# Patient Record
Sex: Female | Born: 1985 | Race: White | Hispanic: No | Marital: Married | Smoking: Never smoker
Health system: Southern US, Community
[De-identification: ages and names within clinical notes are randomized; demographics above are authoritative.]

## PROBLEM LIST (undated history)

## (undated) DIAGNOSIS — N83209 Unspecified ovarian cyst, unspecified side: Secondary | ICD-10-CM

## (undated) DIAGNOSIS — Z789 Other specified health status: Secondary | ICD-10-CM

## (undated) DIAGNOSIS — N943 Premenstrual tension syndrome: Secondary | ICD-10-CM

## (undated) HISTORY — DX: Premenstrual tension syndrome: N94.3

## (undated) HISTORY — PX: WISDOM TOOTH EXTRACTION: SHX21

## (undated) HISTORY — PX: NO PAST SURGERIES: SHX2092

---
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
## 2008-01-14 ENCOUNTER — Ambulatory Visit: Payer: Self-pay | Admitting: Internal Medicine

## 2008-08-22 ENCOUNTER — Ambulatory Visit: Payer: Self-pay | Admitting: Internal Medicine

## 2009-02-26 ENCOUNTER — Ambulatory Visit: Payer: Self-pay | Admitting: Internal Medicine

## 2009-09-24 ENCOUNTER — Ambulatory Visit: Payer: Self-pay | Admitting: Internal Medicine

## 2009-10-19 ENCOUNTER — Ambulatory Visit: Payer: Self-pay | Admitting: Internal Medicine

## 2010-02-09 ENCOUNTER — Ambulatory Visit: Payer: Self-pay | Admitting: Internal Medicine

## 2010-02-11 ENCOUNTER — Ambulatory Visit: Payer: Self-pay | Admitting: Internal Medicine

## 2010-04-19 ENCOUNTER — Ambulatory Visit (INDEPENDENT_AMBULATORY_CARE_PROVIDER_SITE_OTHER): Payer: BC Managed Care – PPO | Admitting: Internal Medicine

## 2010-04-19 DIAGNOSIS — J069 Acute upper respiratory infection, unspecified: Secondary | ICD-10-CM

## 2010-07-09 NOTE — Discharge Summary (Signed)
NAMEMarland Kitchen  Belinda Keith, Belinda Keith NO.:  [id number]   MEDICAL RECORD NO.:  [id number]          PATIENT TYPE:  INP   LOCATION:  6120                         FACILITY:  [facility]   PHYSICIAN:  Caryn Bee, M.D.   DATE OF BIRTH:  10/29/1985   DATE OF ADMISSION:  04/04/2004  DATE OF DISCHARGE:  04/05/2004                                 DISCHARGE SUMMARY   Belinda Keith is a 25 year old Caucasian female who was brought to the emergency  room on the evening of April 04, 2004, secondary to acute alcohol  ingestion at a school party.  The specific time line is that she attended a  pre-party where they had dinner with several friends and en route in the  car, apparently she drank 10-15 ounces of a clear liquid reportedly to be  vodka or a combination of vodka and water.  Arriving at the school party at  approximately 10:30, she was noted apparently by a school official to be  walking somewhat erratically but it was conveyed to the school official that  she was fine.  Unfortunately, she was then found in the bathroom around  11:30 vomiting profusely, she was taken to the administrators office where  she continued to vomit on or about 11:45.  The mother had been called and  arrived in about 5-10 minutes.  Secondary to continued vomiting and her  becoming nonresponsive, EMS was called.  The child was brought by EMS to the  [facility] Emergency Room.  En route, they had to bag mask the child  inbetween episodes of vomiting.  While in the ER, the ER attending assessed  her and made the determination she was unable to protect her airway and,  therefore, intubated her using Etomidate, Ativan, Versed, succinylcholine,  and Pancuronium.  She was placed on maintenance IV fluids.  Her labs at that  time indicated an alcohol level of 252.  Her H&H was 11.9 and 33.5 with a  white count 7.9 and platelets 202.  Her electrolytes were normal, as well,  with a glucose of 103.  Her liver enzymes were  normal.  Her urine tox screen  was normal.  Her chest x-ray indicated no acute infiltrate.  Her EKG was  within normal limits.  There was a concern for possible additional  ingestions, conversations with the mother and school friends who attended  the party, stated there was no indications that she ingested anything else.  Once stabilized, she was transferred to the PICU and maintained on a Versed  drip with Versed boluses for agitation.  She required soft restraints.  By  approximately 8:30 to 9 a.m. on February 12, she was extubated.  She was  noted to be in stable condition.  She was subsequently transferred to the  floor that afternoon and observed overnight.  The PICU attendings worked  with the family answering specific questions.  There were several social  issues that needed to be resolved with the mother and the child which will  be handled on an outpatient basis.   PROCEDURE:  1.  Intubated in the emergency room  secondary to inability to  protect her      airway.  2.  Extubated in the PICU the morning of April 04, 2004, without      complications.   DISCHARGE DIAGNOSIS:  Acute alcohol intoxication.   MEDICATIONS:  In the emergency room, she received Versed, Ativan, Etomidate,  succinylcholine pancuronium, placed on maintenance IV fluids.  In the PICU,  she was maintained with Versed infusion and boluses as needed, and  maintenance IV fluids.  On the floor, she received Tylenol for pain and was  discharged on Tylenol for pain and discomfort.   Her discharge weight was 50 kilograms.  Discharge condition stable.   DISCHARGE INSTRUCTIONS:  1.  Conversations with Dr. Donnie Coffin, the patient will see Dr. Donnie Coffin on April 06, 2004, after hours.  2.  Counseling to be arranged via Dr. Donnie Coffin, will attempt with Dr. Sharol Harness      and Dr. Lindie Spruce, who forwarded Dr. Donnie Coffin and the mother possible      recommendations for counselors in the area.      PR/MEDQ  D:  04/05/2004  T:   04/05/2004  Job:  [id number]

## 2010-07-09 NOTE — Discharge Summary (Signed)
Belinda Keith NO.:  [id number]   MEDICAL RECORD NO.:  [id number]          PATIENT TYPE:  INP   LOCATION:  6121                         FACILITY:  [facility]   PHYSICIAN:  Henrietta Hoover, MD    DATE OF BIRTH:  [birth year]   DATE OF ADMISSION:  04/07/2004  DATE OF DISCHARGE:  04/08/2004                                 DISCHARGE SUMMARY   SIGNIFICANT FINDINGS:  Belinda Keith is a 25 year old female with recent  admission for acute alcohol intoxication who was discharged two days prior  to admission on April 05, 2004.  She had a one day history of abdominal  pain, nausea, some diarrhea, dizziness, and poor p.o. intake.  Initial labs  included a WBC 7.6, hemoglobin 15.3, hematocrit 43, platelets 247, ANC 5.2.  Sodium 139, potassium 4.8, chloride 107, CO2 25, BUN 12, creatinine 0.7,  platelets 74.  AST 40, ALT 15, albumin 4, alkaline phos 74, total bilirubin  1.4, total protein 7.2.  Amylase 91, lipase 20.  Urinalysis negative.  Alcohol less than 5.   HOSPITAL COURSE:  Belinda Keith was treated with an LR bolus x 2, half  maintenance IV fluids, clear diet that was advanced to a regular diet, GI  cocktail, and Tylenol p.r.n.  Prior to discharge, Belinda Keith was tolerating a  regular diet without any complications.  She does have a small amount of  diarrhea that remains prior to discharge.   OPERATIONS AND PROCEDURES:  None.   DIAGNOSIS:  Dehydration, viral gastroenteritis.   DISCHARGE MEDICATIONS:  1.  GI cocktail (180 mL regular Maalox, 180 mL 2% viscous lidocaine, 90 mL      Donnatal elixir) 5 mL p.o. q.6h. p.r.n.  2.  Zantac 75 mg 1 tablet p.o. daily to b.i.d. p.r.n.   DISCHARGE INSTRUCTIONS:  Drink plenty of fluids, at least 8 ounces every 3  hours, rest, eat BRAT diet when have diarrhea.   PENDING ISSUES:  None.   FOLLOW UP:  Dr. Donnie Coffin as needed.  Discharge weight 50.8 kilograms.   CONDITION ON DISCHARGE:  Improved.      PR/MEDQ  D:  04/08/2004  T:   04/08/2004  Job:  [id number]

## 2010-09-20 ENCOUNTER — Encounter: Payer: Self-pay | Admitting: Internal Medicine

## 2010-09-20 ENCOUNTER — Ambulatory Visit (INDEPENDENT_AMBULATORY_CARE_PROVIDER_SITE_OTHER): Payer: BC Managed Care – PPO | Admitting: Internal Medicine

## 2010-09-20 VITALS — BP 118/68 | HR 68 | Temp 99.0°F | Ht 63.0 in | Wt 138.0 lb

## 2010-09-20 DIAGNOSIS — J069 Acute upper respiratory infection, unspecified: Secondary | ICD-10-CM

## 2010-09-20 NOTE — Progress Notes (Signed)
  Subjective:    Patient ID: Belinda Keith, female    DOB: [birth year], 25 y.o.   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 Has had cough and congestion. Bringing up discolored sputum. Denies fever or shaking chills. Cough is worse in the morning. Some cough at night.    Review of Systems     Objective:   Physical Exam  right TM slightly retracted but not red,  left TM dull; pharynx clear without exudate; neck supple without adenopathy; chest clear to auscultation        Assessment & Plan:   URI  Zithromax Z-PAK (6 tablets ) 2 by mouth day one followed by 1 by mouth days 2 through 5 with no refill

## 2010-09-27 ENCOUNTER — Encounter: Payer: Self-pay | Admitting: Internal Medicine

## 2010-09-27 ENCOUNTER — Ambulatory Visit (INDEPENDENT_AMBULATORY_CARE_PROVIDER_SITE_OTHER): Payer: BC Managed Care – PPO | Admitting: Internal Medicine

## 2010-09-27 VITALS — Temp 98.1°F

## 2010-09-27 DIAGNOSIS — Z8659 Personal history of other mental and behavioral disorders: Secondary | ICD-10-CM

## 2010-09-27 DIAGNOSIS — J40 Bronchitis, not specified as acute or chronic: Secondary | ICD-10-CM

## 2010-09-27 NOTE — Progress Notes (Signed)
  Subjective:    Patient ID: Belinda Keith, female    DOB: 25-Jun-1985, 25 y.o.   MRN: [id number]  HPI patient here recently for protracted cough of 3 weeks duration. Has been treated with a Zithromax Z-Pak and is still coughing. Now says cough is less dry and more productive of discolored sputum. Also, she has a form for [employer] [facility] [employer] that needs to be completed. In December 2011, we saw her regarding depression that started the previous summer and fall of 2011. She subsequently saw Veto Kemps for counseling. She also took Zoloft. Says she weaned herself off of Zoloft in June 2012. Issues surrounded a boyfriend and some roommates that were difficult to get along with. She stopped going to class. She lacked motivation to do her school work. Apparently got into trouble with her grade point average. She let her grade point average get below 2.5 she says and now has to have a form completed to be considered for entry back in school. Patient no longer going to counseling. She was able to find other living arrangements in [place]. She got over failed relationship with boyfriend. However, needs a few more classes to graduate. 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 Says she was enrolled in an honors program at [employer] until she let her grade point average slip. This program required that she keep a 2.5 grade point average and above she says. A copy of the form completed was placed in her paper chart.    Review of Systems     Objective:   Physical Exam TMs are clear; pharynx is clear; neck is supple without adenopathy; chest is clear        Assessment & Plan:  Bronchitis  History of depression and poor school performance  Form completed as she requested.  Doxycycline 100 mg twice daily for 10 days

## 2011-04-23 ENCOUNTER — Ambulatory Visit (INDEPENDENT_AMBULATORY_CARE_PROVIDER_SITE_OTHER): Payer: BC Managed Care – PPO | Admitting: Internal Medicine

## 2011-04-23 VITALS — BP 100/70 | Temp 98.8°F | Wt 130.0 lb

## 2011-04-23 DIAGNOSIS — L700 Acne vulgaris: Secondary | ICD-10-CM

## 2011-04-23 DIAGNOSIS — L708 Other acne: Secondary | ICD-10-CM

## 2011-04-23 DIAGNOSIS — J40 Bronchitis, not specified as acute or chronic: Secondary | ICD-10-CM

## 2011-04-25 ENCOUNTER — Encounter: Payer: Self-pay | Admitting: Internal Medicine

## 2011-04-25 LAB — POCT RAPID STREP A (OFFICE): Rapid Strep A Screen: NEGATIVE

## 2011-04-25 NOTE — Progress Notes (Signed)
  Subjective:    Patient ID: Belinda Keith, female    DOB: 1985/04/09, 26 y.o.   MRN: [id number]  HPI  Patient called answering service Saturday, March 2 said saying she was home from [facility] with URI symptoms present for well over a week. Complaining of cough and congestion. Discolored sputum production. Sore throat. After speaking with her personally, advised patient to come to office to be seen on an acute basis.    Review of Systems     Objective:   Physical Exam HEENT exam: Pharynx slightly injected. Rapid strep screen negative. TM slightly full but not red. Neck supple without adenopathy. Chest clear. Pulse oximetry 98% on room air.        Assessment & Plan:  Bronchitis  Plan: Augmentin 500 mg 3 times daily for 10 days.  Also patient brought up a new problem at the end of the visit which was acne vulgaris which have been bothering her recently. She formerly was on doxycycline 100 mg daily per dermatologist. I have called in for her minocycline 100 mg daily since there is a shortage of doxycycline at the present time #30 with 2 refills 1 by mouth daily.

## 2011-04-25 NOTE — Patient Instructions (Signed)
Take Augmentin 500 mg 3 times daily for 10 days for respiratory infection and bronchitis. Call if not better in 10 days.  For acne have prescribed minocycline 100 mg daily.

## 2012-02-01 ENCOUNTER — Telehealth: Payer: Self-pay | Admitting: Internal Medicine

## 2012-02-01 ENCOUNTER — Other Ambulatory Visit: Payer: Self-pay

## 2012-02-01 DIAGNOSIS — Z299 Encounter for prophylactic measures, unspecified: Secondary | ICD-10-CM

## 2012-02-01 MED ORDER — AMOXICILLIN 500 MG PO CAPS: 500.0000 mg | ORAL_CAPSULE | Freq: Three times a day (TID) | ORAL | Status: DC

## 2012-02-01 NOTE — Telephone Encounter (Signed)
Wants antibiotic for overseas travel. Call in Amoxicillin 500 mg tid for 10 days.

## 2012-02-01 NOTE — Telephone Encounter (Signed)
Pt is going out of the country Sunday (02-05-12) to [place]. Pt wanted an antibiotic just in case she gets strep throat while out of the country.

## 2012-02-01 NOTE — Telephone Encounter (Signed)
Call in Amoxicillin 500 mg tid for 10 days please

## 2012-05-17 ENCOUNTER — Emergency Department (HOSPITAL_COMMUNITY)
Admission: EM | Admit: 2012-05-17 | Discharge: 2012-05-17 | Disposition: A | Discharge status: Home / Self Care | Payer: BC Managed Care – PPO | Attending: Emergency Medicine | Admitting: Emergency Medicine

## 2012-05-17 ENCOUNTER — Encounter (HOSPITAL_COMMUNITY): Payer: Self-pay | Admitting: Emergency Medicine

## 2012-05-17 DIAGNOSIS — Z8742 Personal history of other diseases of the female genital tract: Secondary | ICD-10-CM | POA: Insufficient documentation

## 2012-05-17 DIAGNOSIS — Z3202 Encounter for pregnancy test, result negative: Secondary | ICD-10-CM | POA: Insufficient documentation

## 2012-05-17 DIAGNOSIS — R112 Nausea with vomiting, unspecified: Secondary | ICD-10-CM

## 2012-05-17 LAB — CBC WITH DIFFERENTIAL/PLATELET
Basophils Absolute: 0 10*3/uL (ref 0.0–0.1)
Basophils Relative: 1 % (ref 0–1)
Eosinophils Absolute: 0.1 10*3/uL (ref 0.0–0.7)
Eosinophils Relative: 1 % (ref 0–5)
HCT: 40.3 % (ref 36.0–46.0)
Hemoglobin: 14.1 g/dL (ref 12.0–15.0)
Lymphocytes Relative: 38 % (ref 12–46)
Lymphs Abs: 3.3 10*3/uL (ref 0.7–4.0)
MCH: 30.1 pg (ref 26.0–34.0)
MCHC: 35 g/dL (ref 30.0–36.0)
MCV: 85.9 fL (ref 78.0–100.0)
Monocytes Absolute: 0.7 10*3/uL (ref 0.1–1.0)
Monocytes Relative: 8 % (ref 3–12)
Neutro Abs: 4.6 10*3/uL (ref 1.7–7.7)
Neutrophils Relative %: 52 % (ref 43–77)
Platelets: 215 10*3/uL (ref 150–400)
RBC: 4.69 MIL/uL (ref 3.87–5.11)
RDW: 12.7 % (ref 11.5–15.5)
WBC: 8.7 10*3/uL (ref 4.0–10.5)

## 2012-05-17 LAB — POCT I-STAT, CHEM 8
BUN: 10 mg/dL (ref 6–23)
Calcium, Ion: 1.2 mmol/L (ref 1.12–1.23)
Chloride: 103 mEq/L (ref 96–112)
Creatinine, Ser: 0.7 mg/dL (ref 0.50–1.10)
Glucose, Bld: 90 mg/dL (ref 70–99)
HCT: 42 % (ref 36.0–46.0)
Hemoglobin: 14.3 g/dL (ref 12.0–15.0)
Potassium: 3.7 mEq/L (ref 3.5–5.1)
Sodium: 140 mEq/L (ref 135–145)
TCO2: 27 mmol/L (ref 0–100)

## 2012-05-17 LAB — URINALYSIS, ROUTINE W REFLEX MICROSCOPIC
Bilirubin Urine: NEGATIVE
Glucose, UA: NEGATIVE mg/dL
Hgb urine dipstick: NEGATIVE
Ketones, ur: NEGATIVE mg/dL
Leukocytes, UA: NEGATIVE
Nitrite: NEGATIVE
Protein, ur: NEGATIVE mg/dL
Specific Gravity, Urine: 1.006 (ref 1.005–1.030)
Urobilinogen, UA: 0.2 mg/dL (ref 0.0–1.0)
pH: 7 (ref 5.0–8.0)

## 2012-05-17 LAB — POCT PREGNANCY, URINE: Preg Test, Ur: NEGATIVE

## 2012-05-17 LAB — PREGNANCY, URINE: Preg Test, Ur: NEGATIVE

## 2012-05-17 MED ORDER — ONDANSETRON HCL 4 MG/2ML IJ SOLN
4.0000 mg | Freq: Once | INTRAMUSCULAR | Status: AC
  Administered 2012-05-17: 4 mg via INTRAVENOUS
  Filled 2012-05-17: qty 2

## 2012-05-17 MED ORDER — ONDANSETRON HCL 4 MG PO TABS: 4.0000 mg | ORAL_TABLET | Freq: Four times a day (QID) | ORAL | Status: DC

## 2012-05-17 MED ORDER — SODIUM CHLORIDE 0.9 % IV BOLUS (SEPSIS)
1000.0000 mL | Freq: Once | INTRAVENOUS | Status: AC
  Administered 2012-05-17: 1000 mL via INTRAVENOUS

## 2012-05-17 NOTE — ED Notes (Signed)
Pt alert and mentating appropriately upon d.c teaching; pt given d/c teaching and prescriptions; pt given d/c teaching, prescriptions, and follow up care instructions; pt verbalizes understanding and has no further questions upon d/c. NAD noted upon d/c.

## 2012-05-17 NOTE — ED Provider Notes (Signed)
History     CSN: [id number]  Arrival date & time 05/17/12  1454   First MD Initiated Contact with Patient 05/17/12 1806      Chief Complaint  Patient presents with  . Emesis    (Consider location/radiation/quality/duration/timing/severity/associated sxs/prior treatment) HPI Pt presenting with c/o vomiting- nonbloody and nonbilious.  Symptoms started acutely this afternoon.  No diarrhea.  No abdominal pain.  Denies dysuria, no fever/chills.  Pt has been able to keep down gingerale without difficulty.  She has not tried anything prior to arrival.  States she sought care in the ED due to her PMD being out of town.  There are no other associated systemic symptoms, there are no other alleviating or modifying factors.   Past Medical History  Diagnosis Date  . PMS (premenstrual syndrome)     History reviewed. No pertinent past surgical history.  No family history on file.  History  Substance Use Topics  . Smoking status: Never Smoker   . Smokeless tobacco: Not on file  . Alcohol Use: Not on file    OB History   Grav Para Term Preterm Abortions TAB SAB Ect Mult Living                  Review of Systems ROS reviewed and all otherwise negative except for mentioned in HPI  Allergies  Review of patient's allergies indicates no known allergies.  Home Medications   Current Outpatient Rx  Name  Route  Sig  Dispense  Refill  . Ibuprofen (ADVIL PO)   Oral   Take 2 tablets by mouth once.         . ondansetron (ZOFRAN) 4 MG tablet   Oral   Take 1 tablet (4 mg total) by mouth every 6 (six) hours.   12 tablet   0     BP 117/86  Pulse 74  Temp(Src) 98 F (36.7 C) (Oral)  Resp 16  SpO2 100% Vitals reviewed Physical Exam Physical Examination: General appearance - alert, well appearing, and in no distress Mental status - alert, oriented to person, place, and time Eyes - no conjunctival injection, no scleral icterus Mouth - mucous membranes moist, pharynx normal  without lesions Chest - clear to auscultation, no wheezes, rales or rhonchi, symmetric air entry Heart - normal rate, regular rhythm, normal S1, S2, no murmurs, rubs, clicks or gallops Abdomen - soft, nontender, nondistended, no masses or organomegaly, nabs Extremities - peripheral pulses normal, no pedal edema, no clubbing or cyanosis Skin - normal coloration and turgor, no rashes  ED Course  Procedures (including critical care time)  Labs Reviewed  URINALYSIS, ROUTINE W REFLEX MICROSCOPIC  CBC WITH DIFFERENTIAL  PREGNANCY, URINE  POCT PREGNANCY, URINE  POCT I-STAT, CHEM 8   No results found.   1. Nausea and vomiting       MDM  Pt presenting with c/o nausea and vomiting- no diarrhea or abdominal pain.  Abdominal exam benign.  Labs and urine reassuring.  Pt is able to tolerate po fluids after zofran.  Discharged with strict return precautions.  Pt agreeable with plan.        Ethelda Chick, MD 05/17/12 2011

## 2012-05-17 NOTE — ED Notes (Signed)
Feels like room is spinning dizzy no h/a and no abd pain denies dysuria

## 2012-05-17 NOTE — ED Notes (Signed)
Pt states she feels like she is able to keep the fluids down

## 2012-05-17 NOTE — ED Notes (Signed)
Has been vomiting x 2  for the last hour came on sudden got dizzy has had some gingerale and that stayed down feels queasy sweaty

## 2012-11-09 ENCOUNTER — Telehealth: Payer: Self-pay | Admitting: Internal Medicine

## 2012-11-09 NOTE — Telephone Encounter (Signed)
Called office at 5:35 PM complaining of severe headache onset this morning after seeing spots in her eyes. Never had spots in her eyes before but has history of migraine headaches. Has had several episodes of headache this week. No vomiting. Partial relief with ibuprofen earlier in the week. Has not taken anything today. Advised patient takes 600 mg ibuprofen immediately for headache relief. 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 Advised patient that the office is closed. We can see her next week or she can be seen in the emergency department or urgent care sooner if need be.

## 2013-05-03 ENCOUNTER — Encounter (INDEPENDENT_AMBULATORY_CARE_PROVIDER_SITE_OTHER): Payer: 59 | Admitting: Ophthalmology

## 2013-05-03 DIAGNOSIS — H33309 Unspecified retinal break, unspecified eye: Secondary | ICD-10-CM

## 2013-05-03 DIAGNOSIS — H43819 Vitreous degeneration, unspecified eye: Secondary | ICD-10-CM

## 2013-05-03 DIAGNOSIS — H35419 Lattice degeneration of retina, unspecified eye: Secondary | ICD-10-CM

## 2013-05-24 ENCOUNTER — Ambulatory Visit (INDEPENDENT_AMBULATORY_CARE_PROVIDER_SITE_OTHER): Payer: 59 | Admitting: Ophthalmology

## 2013-05-24 DIAGNOSIS — H33309 Unspecified retinal break, unspecified eye: Secondary | ICD-10-CM

## 2013-06-07 ENCOUNTER — Ambulatory Visit (INDEPENDENT_AMBULATORY_CARE_PROVIDER_SITE_OTHER): Payer: 59 | Admitting: Ophthalmology

## 2013-06-21 ENCOUNTER — Ambulatory Visit (INDEPENDENT_AMBULATORY_CARE_PROVIDER_SITE_OTHER): Payer: 59 | Admitting: Ophthalmology

## 2017-09-15 ENCOUNTER — Telehealth: Payer: Self-pay | Admitting: Internal Medicine

## 2017-09-15 NOTE — Telephone Encounter (Signed)
It would be best to check elsewhere for a sooner appt.

## 2017-09-15 NOTE — Telephone Encounter (Signed)
Spoke with patient to advise that Dr. Renold Genta feels it is best for her to find someone else that could see her sooner than mid October or later.  Patient states that she'll speak to her Mom to see if she can recommend someone to her.

## 2017-09-15 NOTE — Telephone Encounter (Signed)
Patient called and wants to know if you will take her back as a patient?  She wants to schedule a CPE.  I told her it would be mid October before she would be able to get in.  Advised that you were not taking new patients right now.  However, I would talk with you and get back to her.    Please let me know if you are willing to take her back. She said she was away at school and just had gotten married now and just needs to get established back with you.    Thank you.

## 2018-02-21 NOTE — L&D Delivery Note (Signed)
Operative Delivery Note At 9:43 PM a viable female was delivered via Vaginal, Vacuum Neurosurgeon).  Presentation: vertex; Position: Right,, Occiput,, Anterior; Station: +2.  Verbal consent: obtained from patient.  Risks and benefits discussed in detail.  Risks include, but are not limited to the risks of anesthesia, bleeding, infection, damage to maternal tissues, fetal cephalhematoma.  There is also the risk of inability to effect vaginal delivery of the head, or shoulder dystocia that cannot be resolved by established maneuvers, leading to the need for emergency cesarean section.  APGAR: 8 9  weight  pending .   Placenta status:spontaneously with 3 vessel cord , .   Cord:  with the following complications:none .  Cord pH: not obtained  Anesthesia:  epidural Instruments: mushroom with 3 pulls and no popoffs Episiotomy: None Lacerations:  2nd Suture Repair: 3.0 chromic Est. Blood Loss (mL):  400 Mom to postpartum.  Baby to Couplet care / Skin to Skin.  Cyril Mourning 12/29/2018, 10:04 PM

## 2018-06-11 LAB — OB RESULTS CONSOLE HIV ANTIBODY (ROUTINE TESTING): HIV: NONREACTIVE

## 2018-06-11 LAB — OB RESULTS CONSOLE RUBELLA ANTIBODY, IGM: Rubella: IMMUNE

## 2018-06-11 LAB — OB RESULTS CONSOLE HEPATITIS B SURFACE ANTIGEN: Hepatitis B Surface Ag: NEGATIVE

## 2018-07-02 LAB — OB RESULTS CONSOLE GC/CHLAMYDIA
Chlamydia: NEGATIVE
Gonorrhea: NEGATIVE

## 2018-10-01 ENCOUNTER — Other Ambulatory Visit: Payer: Self-pay

## 2018-10-01 ENCOUNTER — Inpatient Hospital Stay (HOSPITAL_COMMUNITY)
Admission: AD | Admit: 2018-10-01 | Discharge: 2018-10-01 | Disposition: A | Discharge status: Home / Self Care | Payer: Self-pay | Attending: Obstetrics and Gynecology | Admitting: Obstetrics and Gynecology

## 2018-10-01 ENCOUNTER — Inpatient Hospital Stay (HOSPITAL_BASED_OUTPATIENT_CLINIC_OR_DEPARTMENT_OTHER): Payer: 59

## 2018-10-01 ENCOUNTER — Encounter (HOSPITAL_COMMUNITY): Payer: Self-pay

## 2018-10-01 ENCOUNTER — Inpatient Hospital Stay (HOSPITAL_COMMUNITY)
Admission: AD | Admit: 2018-10-01 | Discharge: 2018-10-01 | Disposition: A | Discharge status: Home / Self Care | Payer: 59 | Attending: Obstetrics and Gynecology | Admitting: Obstetrics and Gynecology

## 2018-10-01 DIAGNOSIS — R109 Unspecified abdominal pain: Secondary | ICD-10-CM | POA: Insufficient documentation

## 2018-10-01 DIAGNOSIS — O3482 Maternal care for other abnormalities of pelvic organs, second trimester: Secondary | ICD-10-CM | POA: Diagnosis not present

## 2018-10-01 DIAGNOSIS — Z3A24 24 weeks gestation of pregnancy: Secondary | ICD-10-CM | POA: Insufficient documentation

## 2018-10-01 DIAGNOSIS — O9989 Other specified diseases and conditions complicating pregnancy, childbirth and the puerperium: Secondary | ICD-10-CM

## 2018-10-01 DIAGNOSIS — D271 Benign neoplasm of left ovary: Secondary | ICD-10-CM | POA: Insufficient documentation

## 2018-10-01 DIAGNOSIS — R102 Pelvic and perineal pain: Secondary | ICD-10-CM

## 2018-10-01 DIAGNOSIS — O26899 Other specified pregnancy related conditions, unspecified trimester: Secondary | ICD-10-CM

## 2018-10-01 DIAGNOSIS — Z3689 Encounter for other specified antenatal screening: Secondary | ICD-10-CM

## 2018-10-01 HISTORY — DX: Other specified health status: Z78.9

## 2018-10-01 LAB — URINALYSIS, ROUTINE W REFLEX MICROSCOPIC
Bilirubin Urine: NEGATIVE
Glucose, UA: NEGATIVE mg/dL
Hgb urine dipstick: NEGATIVE
Ketones, ur: NEGATIVE mg/dL
Leukocytes,Ua: NEGATIVE
Nitrite: NEGATIVE
Protein, ur: NEGATIVE mg/dL
Specific Gravity, Urine: 1.009 (ref 1.005–1.030)
pH: 7 (ref 5.0–8.0)

## 2018-10-01 LAB — FETAL FIBRONECTIN: Fetal Fibronectin: NEGATIVE

## 2018-10-01 MED ORDER — COMFORT FIT MATERNITY SUPP SM MISC: 1.0000 [IU] | Freq: Every day | 0 refills | Status: DC | PRN

## 2018-10-01 NOTE — Discharge Instructions (Signed)
PREGNANCY SUPPORT BELT: You are not alone, Seventy-five percent of women have some sort of abdominal or back pain at some point in their pregnancy. Your baby is growing at a fast pace, which means that your whole body is rapidly trying to adjust to the changes. As your uterus grows, your back may start feeling a bit under stress and this can result in back or abdominal pain that can go from mild, and therefore bearable, to severe pains that will not allow you to sit or lay down comfortably, When it comes to dealing with pregnancy-related pains and cramps, some pregnant women usually prefer natural remedies, which the market is filled with nowadays. For example, wearing a pregnancy support belt can help ease and lessen your discomfort and pain. WHAT ARE THE BENEFITS OF WEARING A PREGNANCY SUPPORT BELT? A pregnancy support belt provides support to the lower portion of the belly taking some of the weight of the growing uterus and distributing to the other parts of your body. It is designed make you comfortable and gives you extra support. Over the years, the pregnancy apparel market has been studying the needs and wants of pregnant women and they have come up with the most comfortable pregnancy support belts that woman could ever ask for. In fact, you will no longer have to wear a stretched-out or bulky pregnancy belt that is visible underneath your clothes and makes you feel even more uncomfortable. Nowadays, a pregnancy support belt is made of comfortable and stretchy materials that will not irritate your skin but will actually make you feel at ease and you will not even notice you are wearing it. They are easy to put on and adjust during the day and can be worn at night for additional support.  BENEFITS:  Relives Back pain  Relieves Abdominal Muscle and Leg Pain  Stabilizes the Pelvic Ring  Offers a Cushioned Abdominal Lift Pad  Relieves pressure on the Sciatic Nerve Within Minutes WHERE TO GET  YOUR PREGNANCY BELT: [employer] [phone] [id number]  [place], [place] [address]   Abdominal Pain During Pregnancy  Abdominal pain is common during pregnancy, and has many possible causes. Some causes are more serious than others, and sometimes the cause is not known. Abdominal pain can be a sign that labor is starting. It can also be caused by normal growth and stretching of muscles and ligaments during pregnancy. Always tell your health care provider if you have any abdominal pain. Follow these instructions at home:  Do not have sex or put anything in your vagina until your pain goes away completely.  Get plenty of rest until your pain improves.  Drink enough fluid to keep your urine pale yellow.  Take over-the-counter and prescription medicines only as told by your health care provider.  Keep all follow-up visits as told by your health care provider. This is important. Contact a health care provider if:  Your pain continues or gets worse after resting.  You have lower abdominal pain that: ? Comes and goes at regular intervals. ? Spreads to your back. ? Is similar to menstrual cramps.  You have pain or burning when you urinate. Get help right away if:  You have a fever or chills.  You have vaginal bleeding.  You are leaking fluid from your vagina.  You are passing tissue from your vagina.  You have vomiting or diarrhea that lasts for more than 24 hours.  Your baby is moving less than usual.  You feel very weak or faint.  You have shortness of breath.  You develop severe pain in your upper abdomen. Summary  Abdominal pain is common during pregnancy, and has many possible causes.  If you experience abdominal pain during pregnancy, tell your health care provider right away.  Follow your health care provider's home care instructions and keep all follow-up visits as directed. This information is not intended to replace advice given to you by  your health care provider. Make sure you discuss any questions you have with your health care provider. Document Released: 02/07/2005 Document Revised: 05/28/2018 Document Reviewed: 05/12/2016 Elsevier Patient Education  [place].   Back Pain in Pregnancy Back pain during pregnancy is common. Back pain may be caused by several factors that are related to changes during your pregnancy. Follow these instructions at home: Managing pain, stiffness, and swelling      If directed, for sudden (acute) back pain, put ice on the painful area. ? Put ice in a plastic bag. ? Place a towel between your skin and the bag. ? Leave the ice on for 20 minutes, 2-3 times per day.  If directed, apply heat to the affected area before you exercise. Use the heat source that your health care provider recommends, such as a moist heat pack or a heating pad. ? Place a towel between your skin and the heat source. ? Leave the heat on for 20-30 minutes. ? Remove the heat if your skin turns bright red. This is especially important if you are unable to feel pain, heat, or cold. You may have a greater risk of getting burned.  If directed, massage the affected area. Activity  Exercise as told by your health care provider. Gentle exercise is the best way to prevent or manage back pain.  Listen to your body when lifting. If lifting hurts, ask for help or bend your knees. This uses your leg muscles instead of your back muscles.  Squat down when picking up something from the floor. Do not bend over.  Only use bed rest for short periods as told by your health care provider. Bed rest should only be used for the most severe episodes of back pain. Standing, sitting, and lying down  Do not stand in one place for long periods of time.  Use good posture when sitting. Make sure your head rests over your shoulders and is not hanging forward. Use a pillow on your lower back if necessary.  Try sleeping on your side,  preferably the left side, with a pregnancy support pillow or 1-2 regular pillows between your legs. ? If you have back pain after a night's rest, your bed may be too soft. ? A firm mattress may provide more support for your back during pregnancy. General instructions  Do not wear high heels.  Eat a healthy diet. Try to gain weight within your health care provider's recommendations.  Use a maternity girdle, elastic sling, or back brace as told by your health care provider.  Take over-the-counter and prescription medicines only as told by your health care provider.  Work with a physical therapist or massage therapist to find ways to manage back pain. Acupuncture or massage therapy may be helpful.  Keep all follow-up visits as told by your health care provider. This is important. Contact a health care provider if:  Your back pain interferes with your daily activities.  You have increasing pain in other parts of your body. Get help right away if:  You develop numbness, tingling, weakness, or problems with the use of your arms or legs.  You develop severe back pain that is not controlled with medicine.  You have a change in bowel or bladder control.  You develop shortness of breath, dizziness, or you faint.  You develop nausea, vomiting, or sweating.  You have back pain that is a rhythmic, cramping pain similar to labor pains. Labor pain is usually 1-2 minutes apart, lasts for about 1 minute, and involves a bearing down feeling or pressure in your pelvis.  You have back pain and your water breaks or you have vaginal bleeding.  You have back pain or numbness that travels down your leg.  Your back pain developed after you fell.  You develop pain on one side of your back.  You see blood in your urine.  You develop skin blisters in the area of your back pain. Summary  Back pain may be caused by several factors that are related to changes during your pregnancy.  Follow  instructions as told by your health care provider for managing pain, stiffness, and swelling.  Exercise as told by your health care provider. Gentle exercise is the best way to prevent or manage back pain.  Take over-the-counter and prescription medicines only as told by your health care provider.  Keep all follow-up visits as told by your health care provider. This is important. This information is not intended to replace advice given to you by your health care provider. Make sure you discuss any questions you have with your health care provider. Document Released: 05/18/2005 Document Revised: 05/29/2018 Document Reviewed: 07/26/2017 Elsevier Patient Education  2020 [employer].  Preterm Labor and Birth Information  The normal length of a pregnancy is 39-41 weeks. Preterm labor is when labor starts before 37 completed weeks of pregnancy. What are the risk factors for preterm labor? Preterm labor is more likely to occur in women who:  Have certain infections during pregnancy such as a bladder infection, sexually transmitted infection, or infection inside the uterus (chorioamnionitis).  Have a shorter-than-normal cervix.  Have gone into preterm labor before.  Have had surgery on their cervix.  Are younger than age 60 or older than age 12.  Are African American.  Are pregnant with twins or multiple babies (multiple gestation).  Take street drugs or smoke while pregnant.  Do not gain enough weight while pregnant.  Became pregnant shortly after having been pregnant. What are the symptoms of preterm labor? Symptoms of preterm labor include:  Cramps similar to those that can happen during a menstrual period. The cramps may happen with diarrhea.  Pain in the abdomen or lower back.  Regular uterine contractions that may feel like tightening of the abdomen.  A feeling of increased pressure in the pelvis.  Increased watery or bloody mucus discharge from the vagina.  Water  breaking (ruptured amniotic sac). Why is it important to recognize signs of preterm labor? It is important to recognize signs of preterm labor because babies who are born prematurely may not be fully developed. This can put them at an increased risk for:  Long-term (chronic) heart and lung problems.  Difficulty immediately after birth with regulating body systems, including blood sugar, body temperature, heart rate, and breathing rate.  Bleeding in the brain.  Cerebral palsy.  Learning difficulties.  Death. These risks are highest for babies who are born before 61 weeks of pregnancy. How is preterm labor treated? Treatment depends on the length of your pregnancy,  your condition, and the health of your baby. It may involve:  Having a stitch (suture) placed in your cervix to prevent your cervix from opening too early (cerclage).  Taking or being given medicines, such as: ? Hormone medicines. These may be given early in pregnancy to help support the pregnancy. ? Medicine to stop contractions. ? Medicines to help mature the babys lungs. These may be prescribed if the risk of delivery is high. ? Medicines to prevent your baby from developing cerebral palsy. If the labor happens before 34 weeks of pregnancy, you may need to stay in the hospital. What should I do if I think I am in preterm labor? If you think that you are going into preterm labor, call your health care provider right away. How can I prevent preterm labor in future pregnancies? To increase your chance of having a full-term pregnancy:  Do not use any tobacco products, such as cigarettes, chewing tobacco, and e-cigarettes. If you need help quitting, ask your health care provider.  Do not use street drugs or medicines that have not been prescribed to you during your pregnancy.  Talk with your health care provider before taking any herbal supplements, even if you have been taking them regularly.  Make sure you gain a  healthy amount of weight during your pregnancy.  Watch for infection. If you think that you might have an infection, get it checked right away.  Make sure to tell your health care provider if you have gone into preterm labor before. This information is not intended to replace advice given to you by your health care provider. Make sure you discuss any questions you have with your health care provider. Document Released: 04/30/2003 Document Revised: 06/01/2018 Document Reviewed: 07/01/2015 Elsevier Patient Education  [place]. Round Ligament Pain  The round ligament is a cord of muscle and tissue that helps support the uterus. It can become a source of pain during pregnancy if it becomes stretched or twisted as the baby grows. The pain usually begins in the second trimester (13-28 weeks) of pregnancy, and it can come and go until the baby is delivered. It is not a serious problem, and it does not cause harm to the baby. Round ligament pain is usually a short, sharp, and pinching pain, but it can also be a dull, lingering, and aching pain. The pain is felt in the lower side of the abdomen or in the groin. It usually starts deep in the groin and moves up to the outside of the hip area. The pain may occur when you:  Suddenly change position, such as quickly going from a sitting to standing position.  Roll over in bed.  Cough or sneeze.  Do physical activity. Follow these instructions at home:   Watch your condition for any changes.  When the pain starts, relax. Then try any of these methods to help with the pain: ? Sitting down. ? Flexing your knees up to your abdomen. ? Lying on your side with one pillow under your abdomen and another pillow between your legs. ? Sitting in a warm bath for 15-20 minutes or until the pain goes away.  Take over-the-counter and prescription medicines only as told by your health care provider.  Move slowly when you sit down or stand up.  Avoid  long walks if they cause pain.  Stop or reduce your physical activities if they cause pain.  Keep all follow-up visits as told by your health care provider. This is  important. Contact a health care provider if:  Your pain does not go away with treatment.  You feel pain in your back that you did not have before.  Your medicine is not helping. Get help right away if:  You have a fever or chills.  You develop uterine contractions.  You have vaginal bleeding.  You have nausea or vomiting.  You have diarrhea.  You have pain when you urinate. Summary  Round ligament pain is felt in the lower abdomen or groin. It is usually a short, sharp, and pinching pain. It can also be a dull, lingering, and aching pain.  This pain usually begins in the second trimester (13-28 weeks). It occurs because the uterus is stretching with the growing baby, and it is not harmful to the baby.  You may notice the pain when you suddenly change position, when you cough or sneeze, or during physical activity.  Relaxing, flexing your knees to your abdomen, lying on one side, or taking a warm bath may help to get rid of the pain.  Get help from your health care provider if the pain does not go away or if you have vaginal bleeding, nausea, vomiting, diarrhea, or painful urination. This information is not intended to replace advice given to you by your health care provider. Make sure you discuss any questions you have with your health care provider. Document Released: 11/17/2007 Document Revised: 07/26/2017 Document Reviewed: 07/26/2017 Elsevier Patient Education  2020 [employer].

## 2018-10-01 NOTE — MAU Note (Signed)
Pt c/o sharp lower left abdominal pain which started this morning. This occurs mostly when she moves. Denies LOF, discharge, VB, urinary symptoms. +FM

## 2018-10-01 NOTE — MAU Provider Note (Signed)
History     CSN: [id number]  Arrival date and time: 10/01/18 1941   First Provider Initiated Contact with Patient 10/01/18 1018      Chief Complaint  Patient presents with  . Abdominal Pain   Ms. Belinda Keith is a 33 y.o. G1P0 at [redacted]w[redacted]d who presents to MAU for left-sided abdominal pain.  Onset: this morning Location: left-sided abdomen/middle of lower abdomen Duration: <24hrs Character: intermittent, sharp, stabbing, followed by dull ache Aggravating/Associated: movement, sitting up, standing up from a sitting position, left-sided laying/none Relieving: not moving, laying on back Treatment: none Severity: 7/10 at worst, currently 0/10  Pt denies VB, LOF, ctx, decreased FM, vaginal discharge/odor/itching. Pt denies N/V, constipation, diarrhea, or urinary problems. Pt denies fever, chills, fatigue, sweating or changes in appetite. Pt denies SOB or chest pain. Pt denies dizziness, HA, light-headedness, weakness.  Problems this pregnancy include: none. Allergies? NKDA Current medications/supplements? PNVs Prenatal care provider? [facility], next appt 10/17/2018  Pt's mother present for entire visit.   OB History    Gravida  1   Para      Term      Preterm      AB      Living        SAB      TAB      Ectopic      Multiple      Live Births              Past Medical History:  Diagnosis Date  . Medical history non-contributory   . PMS (premenstrual syndrome)     Past Surgical History:  Procedure Laterality Date  . NO PAST SURGERIES    . WISDOM TOOTH EXTRACTION      History reviewed. No pertinent family history.  Social History   Tobacco Use  . Smoking status: Never Smoker  Substance Use Topics  . Alcohol use: Not on file  . Drug use: Not on file    Allergies: No Known Allergies  Medications Prior to Admission  Medication Sig Dispense Refill Last Dose  . Prenatal Vit-Fe Fumarate-FA (MULTIVITAMIN-PRENATAL) 27-0.8 MG  TABS tablet Take 1 tablet by mouth daily at 12 noon.   09/30/2018 at Unknown time  . Ibuprofen (ADVIL PO) Take 2 tablets by mouth once.   Unknown at Unknown time  . ondansetron (ZOFRAN) 4 MG tablet Take 1 tablet (4 mg total) by mouth every 6 (six) hours. 12 tablet 0 Unknown at Unknown time    Review of Systems  Constitutional: Negative for chills, diaphoresis, fatigue and fever.  Respiratory: Negative for shortness of breath.   Cardiovascular: Negative for chest pain.  Gastrointestinal: Positive for abdominal pain. Negative for constipation, diarrhea, nausea and vomiting.  Genitourinary: Negative for dysuria, flank pain, frequency, pelvic pain, urgency, vaginal bleeding and vaginal discharge.  Neurological: Negative for dizziness, weakness, light-headedness and headaches.   Physical Exam   Blood pressure 103/69, pulse 81, temperature 98.5 F (36.9 C), temperature source Oral, height 5\' 2"  (1.575 m), weight 66.8 kg, SpO2 100 %.  Patient Vitals for the past 24 hrs:  BP Temp Temp src Pulse SpO2 Height Weight  10/01/18 1300 103/69 - - - - - -  10/01/18 0942 (!) 108/57 - - 81 - - -  10/01/18 0918 107/60 98.5 F (36.9 C) Oral 87 100 % 5\' 2"  (1.575 m) 66.8 kg   Physical Exam  Constitutional: She is oriented to person, place, and time. She appears well-developed and well-nourished. No distress.  HENT:  Head: Normocephalic and atraumatic.  Respiratory: Effort normal.  GI: Soft. She exhibits no distension and no mass. There is no abdominal tenderness. There is no rebound and no guarding.  Genitourinary:    Genitourinary Comments: CE: posterior/shortened on exam/no dilation   Neurological: She is alert and oriented to person, place, and time.  Skin: Skin is warm and dry. She is not diaphoretic.  Psychiatric: She has a normal mood and affect. Her behavior is normal. Judgment and thought content normal.   Results for orders placed or performed during the hospital encounter of 10/01/18 (from the  past 24 hour(s))  Urinalysis, Routine w reflex microscopic     Status: None   Collection Time: 10/01/18  9:50 AM  Result Value Ref Range   Color, Urine YELLOW YELLOW   APPearance CLEAR CLEAR   Specific Gravity, Urine 1.009 1.005 - 1.030   pH 7.0 5.0 - 8.0   Glucose, UA NEGATIVE NEGATIVE mg/dL   Hgb urine dipstick NEGATIVE NEGATIVE   Bilirubin Urine NEGATIVE NEGATIVE   Ketones, ur NEGATIVE NEGATIVE mg/dL   Protein, ur NEGATIVE NEGATIVE mg/dL   Nitrite NEGATIVE NEGATIVE   Leukocytes,Ua NEGATIVE NEGATIVE  Fetal fibronectin     Status: None   Collection Time: 10/01/18 10:45 AM  Result Value Ref Range   Fetal Fibronectin NEGATIVE NEGATIVE   [place] Mfm Ob Transvaginal  Result Date: 10/01/2018 ----------------------------------------------------------------------  OBSTETRICS REPORT                       (Signed Final 10/01/2018 12:58 pm) ---------------------------------------------------------------------- Patient Info  ID #:       [id number]                          D.O.B.:  12-Jan-1986 (32 yrs)  Name:       Belinda Keith             Visit Date: 10/01/2018 10:58 am ---------------------------------------------------------------------- Performed By  Performed By:     Enriqueta Shutter           Referred By:      MAU Nursing-                    RDMS, RVT                                MAU/Triage  Attending:        Tama High MD        [facility]                                                             [place] ---------------------------------------------------------------------- Orders   #  Description                          Code         Ordered By   1  [place] MFM OB TRANSVAGINAL               97673.4      NICOLE NUGENT  ----------------------------------------------------------------------   #  Order #  Accession #                 Episode #   1  [id number]                    [id number]                  [id number]   ---------------------------------------------------------------------- Indications   Abdominal pain in pregnancy left side          O99.89   Family history of PTD   [redacted] weeks gestation of pregnancy                Z3A.24  ---------------------------------------------------------------------- Fetal Evaluation  Num Of Fetuses:         1  Fetal Heart Rate(bpm):  148  Cardiac Activity:       Observed  Presentation:           Cephalic  Placenta:               Posterior  Amniotic Fluid  AFI FV:      Subjectively normal                              Largest Pocket(cm)                              7.65 ---------------------------------------------------------------------- OB History  Gravidity:    1         Term:   0 ---------------------------------------------------------------------- Gestational Age  LMP:           24w 6d        Date:  04/10/18                 EDD:   01/15/19  Best:          Sharmon Leyden 6d     Det. By:  LMP  (04/10/18)          EDD:   01/15/19 ---------------------------------------------------------------------- Cervix Uterus Adnexa  Cervix  Length:            3.7  cm.  Normal appearance by transvaginal scan  Left Ovary  Complex mass = 6.4 x 3.7 x 4.6 cm. Cystic teratoma.  Right Ovary  Within normal limits.  Comment  Shortest length after abdominal compression is  3.7 cm.  No dynamic changes seen. ---------------------------------------------------------------------- Impression  Patient is being evaluated in the MAU for left-sided  abdominal pain.  Amniotic fluid is normal and good fetal activity is seen. On  transvaginal scan, the cervix measures 3.7 cm, which is  normal. Left ovarian complex mass, measuring 6.4 x 3.7 x 4.6  cm, is seen. Patient is aware of the presence of mass from  previous ultrasound. ----------------------------------------------------------------------                  Tama High, MD Electronically Signed Final Report   10/01/2018 12:58 pm  ----------------------------------------------------------------------   MAU Course  Procedures  MDM -suspect round ligament pain, r/o PTL -negative abdominal exam -UA: WNL -fFN: negative -CE: felt shortened on exam, no dilation noted -[place]: CL 3.7cm, complex mass on left ovary cystic teratoma, pt aware of teratoma from anatomy scan -EFM: reactive       -baseline: 150       -variability: moderate       -accels: present, 10x10       -decels: present,  few variable       -TOCO: irritability -pt discharged to home in stable condition  Orders Placed This Encounter  Procedures  . [place] MFM OB Transvaginal    Please measure cervical length. Family history of PTD.    Standing Status:   Standing    Number of Occurrences:   1    Order Specific Question:   Symptom/Reason for Exam    Answer:   Abdominal pain affecting pregnancy [[id number]]  . Urinalysis, Routine w reflex microscopic    Standing Status:   Standing    Number of Occurrences:   1  . Fetal fibronectin    Standing Status:   Standing    Number of Occurrences:   1  . Discharge patient    Order Specific Question:   Discharge disposition    Answer:   01-Home or Self Care [1]    Order Specific Question:   Discharge patient date    Answer:   10/01/2018   Meds ordered this encounter  Medications  . Elastic Bandages & Supports (COMFORT FIT MATERNITY SUPP SM) MISC    Sig: 1 Units by Does not apply route daily as needed.    Dispense:  1 each    Refill:  0    Order Specific Question:   Supervising Provider    Answer:   Verita Schneiders A [[id number]]   Assessment and Plan   1. Pain of round ligament during pregnancy   2. Abdominal pain affecting pregnancy   3. [redacted] weeks gestation of pregnancy   4. NST (non-stress test) reactive   5. Cystic teratoma of left ovary    Allergies as of 10/01/2018   No Known Allergies     Medication List    STOP taking these medications   ADVIL PO     TAKE these medications   Comfort Fit Maternity  Supp Sm Misc 1 Units by Does not apply route daily as needed.   multivitamin-prenatal 27-0.8 MG Tabs tablet Take 1 tablet by mouth daily at 12 noon.   ondansetron 4 MG tablet Commonly known as: ZOFRAN Take 1 tablet (4 mg total) by mouth every 6 (six) hours.      -discussed cystic teratoma, pt aware and will follow-up with OB at next visit -discussed causes of round ligament pain, pharmacologic and non-pharmacologic interventions -RX for pregnancy belt -pt declines RX for Flexeril -discussed interaction of pain with round ligament and cystic teratoma -pain/bleeding/PTL/return MAU precautions discussed -pt discharged to home in stable condition  Elmyra Ricks E Nugent 10/01/2018, 1:35 PM

## 2018-10-02 ENCOUNTER — Encounter (HOSPITAL_COMMUNITY): Payer: Self-pay

## 2018-12-24 LAB — OB RESULTS CONSOLE GBS: GBS: NEGATIVE

## 2018-12-29 ENCOUNTER — Inpatient Hospital Stay (HOSPITAL_COMMUNITY): Payer: 59 | Admitting: Anesthesiology

## 2018-12-29 ENCOUNTER — Inpatient Hospital Stay (HOSPITAL_COMMUNITY)
Admission: AD | Admit: 2018-12-29 | Discharge: 2019-01-01 | DRG: 807 | Disposition: A | Discharge status: Home / Self Care | Payer: 59 | Attending: Obstetrics and Gynecology | Admitting: Obstetrics and Gynecology

## 2018-12-29 ENCOUNTER — Other Ambulatory Visit: Payer: Self-pay

## 2018-12-29 ENCOUNTER — Encounter (HOSPITAL_COMMUNITY): Payer: Self-pay

## 2018-12-29 DIAGNOSIS — Z3A37 37 weeks gestation of pregnancy: Secondary | ICD-10-CM | POA: Diagnosis not present

## 2018-12-29 DIAGNOSIS — O26893 Other specified pregnancy related conditions, third trimester: Secondary | ICD-10-CM | POA: Diagnosis present

## 2018-12-29 DIAGNOSIS — O9902 Anemia complicating childbirth: Principal | ICD-10-CM | POA: Diagnosis present

## 2018-12-29 DIAGNOSIS — D649 Anemia, unspecified: Secondary | ICD-10-CM | POA: Diagnosis present

## 2018-12-29 DIAGNOSIS — Z20828 Contact with and (suspected) exposure to other viral communicable diseases: Secondary | ICD-10-CM | POA: Diagnosis present

## 2018-12-29 HISTORY — DX: Maternal care for other abnormalities of pelvic organs, third trimester: N83.209

## 2018-12-29 LAB — TYPE AND SCREEN
ABO/RH(D): A POS
Antibody Screen: NEGATIVE

## 2018-12-29 LAB — OB RESULTS CONSOLE ABO/RH: RH Type: POSITIVE

## 2018-12-29 LAB — CBC
HCT: 34.6 % — ABNORMAL LOW (ref 36.0–46.0)
Hemoglobin: 11.7 g/dL — ABNORMAL LOW (ref 12.0–15.0)
MCH: 29.8 pg (ref 26.0–34.0)
MCHC: 33.8 g/dL (ref 30.0–36.0)
MCV: 88.3 fL (ref 80.0–100.0)
Platelets: 252 10*3/uL (ref 150–400)
RBC: 3.92 MIL/uL (ref 3.87–5.11)
RDW: 13.2 % (ref 11.5–15.5)
WBC: 15 10*3/uL — ABNORMAL HIGH (ref 4.0–10.5)
nRBC: 0 % (ref 0.0–0.2)

## 2018-12-29 LAB — POCT FERN TEST: POCT Fern Test: POSITIVE

## 2018-12-29 LAB — SARS CORONAVIRUS 2 (TAT 6-24 HRS): SARS Coronavirus 2: NEGATIVE

## 2018-12-29 LAB — ABO/RH: ABO/RH(D): A POS

## 2018-12-29 MED ORDER — FLEET ENEMA 7-19 GM/118ML RE ENEM: 1.0000 | ENEMA | RECTAL | Status: DC | PRN

## 2018-12-29 MED ORDER — LACTATED RINGERS IV SOLN: 500.0000 mL | INTRAVENOUS | Status: DC | PRN

## 2018-12-29 MED ORDER — LACTATED RINGERS IV SOLN
500.0000 mL | Freq: Once | INTRAVENOUS | Status: AC
  Administered 2018-12-29: 500 mL via INTRAVENOUS

## 2018-12-29 MED ORDER — OXYCODONE-ACETAMINOPHEN 5-325 MG PO TABS: 1.0000 | ORAL_TABLET | ORAL | Status: DC | PRN

## 2018-12-29 MED ORDER — LIDOCAINE HCL (PF) 1 % IJ SOLN
INTRAMUSCULAR | Status: DC | PRN
  Administered 2018-12-29: 6 mL via EPIDURAL

## 2018-12-29 MED ORDER — PHENYLEPHRINE 40 MCG/ML (10ML) SYRINGE FOR IV PUSH (FOR BLOOD PRESSURE SUPPORT): 80.0000 ug | PREFILLED_SYRINGE | INTRAVENOUS | Status: DC | PRN

## 2018-12-29 MED ORDER — OXYTOCIN 40 UNITS IN NORMAL SALINE INFUSION - SIMPLE MED
1.0000 m[IU]/min | INTRAVENOUS | Status: DC
  Administered 2018-12-29: 2 m[IU]/min via INTRAVENOUS
  Filled 2018-12-29: qty 1000

## 2018-12-29 MED ORDER — SODIUM CHLORIDE (PF) 0.9 % IJ SOLN
INTRAMUSCULAR | Status: DC | PRN
  Administered 2018-12-29: 12 mL/h via EPIDURAL

## 2018-12-29 MED ORDER — FENTANYL-BUPIVACAINE-NACL 0.5-0.125-0.9 MG/250ML-% EP SOLN
12.0000 mL/h | EPIDURAL | Status: DC | PRN
  Filled 2018-12-29: qty 250

## 2018-12-29 MED ORDER — ONDANSETRON HCL 4 MG/2ML IJ SOLN: 4.0000 mg | Freq: Four times a day (QID) | INTRAMUSCULAR | Status: DC | PRN

## 2018-12-29 MED ORDER — OXYTOCIN 40 UNITS IN NORMAL SALINE INFUSION - SIMPLE MED
2.5000 [IU]/h | INTRAVENOUS | Status: DC
  Administered 2018-12-29 (×2): 2.5 [IU]/h via INTRAVENOUS

## 2018-12-29 MED ORDER — EPHEDRINE 5 MG/ML INJ: 10.0000 mg | INTRAVENOUS | Status: DC | PRN

## 2018-12-29 MED ORDER — SOD CITRATE-CITRIC ACID 500-334 MG/5ML PO SOLN
30.0000 mL | ORAL | Status: DC | PRN
  Administered 2018-12-29: 15 mL via ORAL
  Filled 2018-12-29: qty 30

## 2018-12-29 MED ORDER — TERBUTALINE SULFATE 1 MG/ML IJ SOLN: 0.2500 mg | Freq: Once | INTRAMUSCULAR | Status: DC | PRN

## 2018-12-29 MED ORDER — LIDOCAINE HCL (PF) 1 % IJ SOLN: 30.0000 mL | INTRAMUSCULAR | Status: DC | PRN

## 2018-12-29 MED ORDER — DIPHENHYDRAMINE HCL 50 MG/ML IJ SOLN: 12.5000 mg | INTRAMUSCULAR | Status: DC | PRN

## 2018-12-29 MED ORDER — OXYCODONE-ACETAMINOPHEN 5-325 MG PO TABS: 2.0000 | ORAL_TABLET | ORAL | Status: DC | PRN

## 2018-12-29 MED ORDER — OXYTOCIN BOLUS FROM INFUSION
500.0000 mL | Freq: Once | INTRAVENOUS | Status: AC
  Administered 2018-12-29: 500 mL via INTRAVENOUS

## 2018-12-29 MED ORDER — LACTATED RINGERS IV SOLN
INTRAVENOUS | Status: DC
  Administered 2018-12-29: 18:00:00 via INTRAVENOUS

## 2018-12-29 MED ORDER — ACETAMINOPHEN 325 MG PO TABS: 650.0000 mg | ORAL_TABLET | ORAL | Status: DC | PRN

## 2018-12-29 NOTE — Anesthesia Procedure Notes (Signed)
Epidural Patient location during procedure: OB Start time: 12/29/2018 2:15 PM End time: 12/29/2018 2:17 PM  Staffing Anesthesiologist: Lyn Hollingshead, MD Performed: anesthesiologist   Preanesthetic Checklist Completed: patient identified, site marked, surgical consent, pre-op evaluation, timeout performed, IV checked, risks and benefits discussed and monitors and equipment checked  Epidural Patient position: sitting Prep: site prepped and draped and DuraPrep Patient monitoring: continuous pulse ox and blood pressure Approach: midline Location: L3-L4 Injection technique: LOR air  Needle:  Needle type: Tuohy  Needle gauge: 17 G Needle length: 9 cm and 9 Needle insertion depth: 5 cm cm Catheter type: closed end flexible Catheter size: 19 Gauge Catheter at skin depth: 10 cm Test dose: negative and Other  Assessment Events: blood not aspirated, injection not painful, no injection resistance, negative IV test and no paresthesia  Additional Notes Reason for block:procedure for pain

## 2018-12-29 NOTE — Anesthesia Preprocedure Evaluation (Signed)
Anesthesia Evaluation  Patient identified by MRN, date of birth, ID band Patient awake    Reviewed: Allergy & Precautions, H&P , NPO status , Patient's Chart, lab work & pertinent test results  Airway Mallampati: I  TM Distance: >3 FB Neck ROM: full    Dental no notable dental hx. (+) Teeth Intact   Pulmonary neg pulmonary ROS,    Pulmonary exam normal breath sounds clear to auscultation       Cardiovascular negative cardio ROS Normal cardiovascular exam Rhythm:regular Rate:Normal     Neuro/Psych negative neurological ROS  negative psych ROS   GI/Hepatic negative GI ROS, Neg liver ROS,   Endo/Other  negative endocrine ROS  Renal/GU negative Renal ROS  negative genitourinary   Musculoskeletal negative musculoskeletal ROS (+)   Abdominal Normal abdominal exam  (+)   Peds  Hematology  (+) Blood dyscrasia, anemia ,   Anesthesia Other Findings   Reproductive/Obstetrics (+) Pregnancy                             Anesthesia Physical Anesthesia Plan  ASA: II  Anesthesia Plan: Epidural   Post-op Pain Management:    Induction:   PONV Risk Score and Plan:   Airway Management Planned:   Additional Equipment:   Intra-op Plan:   Post-operative Plan:   Informed Consent: I have reviewed the patients History and Physical, chart, labs and discussed the procedure including the risks, benefits and alternatives for the proposed anesthesia with the patient or authorized representative who has indicated his/her understanding and acceptance.       Plan Discussed with:   Anesthesia Plan Comments:         Anesthesia Quick Evaluation

## 2018-12-29 NOTE — H&P (Signed)
TALLULA KABALA is a 33 y.o. G 1 P 0 at 73 w 4 days presents with SROM and contractions. Now requesting epidural. OB History    Gravida  1   Para      Term      Preterm      AB      Living        SAB      TAB      Ectopic      Multiple      Live Births             Past Medical History:  Diagnosis Date  . Medical history non-contributory   . PMS (premenstrual syndrome)    Past Surgical History:  Procedure Laterality Date  . NO PAST SURGERIES    . WISDOM TOOTH EXTRACTION     Family History: family history is not on file. Social History:  reports that she has never smoked. She has never used smokeless tobacco. She reports that she does not use drugs. No history on file for alcohol.     Maternal Diabetes: No Genetic Screening: Normal Maternal Ultrasounds/Referrals: Normal Fetal Ultrasounds or other Referrals:  None Maternal Substance Abuse:  No Significant Maternal Medications:  None Significant Maternal Lab Results:  None Other Comments:  None  Review of Systems  All other systems reviewed and are negative.  Maternal Medical History:  Reason for admission: Rupture of membranes and contractions.     Dilation: 3 Effacement (%): 70 Station: -1 Exam by:: robinson rn Blood pressure 140/76, pulse 84, temperature 98.5 F (36.9 C), temperature source Oral, resp. rate 20, SpO2 100 %. Maternal Exam:  Uterine Assessment: Contraction strength is moderate.  Contraction frequency is regular.   Abdomen: Fetal presentation: vertex     Fetal Exam Fetal State Assessment: Category I - tracings are normal.     Physical Exam  Nursing note and vitals reviewed. Constitutional: She appears well-developed and well-nourished.  HENT:  Head: Normocephalic.  Eyes: Pupils are equal, round, and reactive to light.  Neck: Normal range of motion.  Cardiovascular: Normal rate and regular rhythm.  Respiratory: Effort normal.  GI: Soft.    Prenatal labs: ABO,  Rh:   Antibody:   Rubella:   RPR:    HBsAg:    HIV:    GBS:     Assessment/Plan: IUP at term SROM Labor Epidural Pitocin prn  Cyril Mourning 12/29/2018, 2:03 PM

## 2018-12-29 NOTE — MAU Note (Signed)
Belinda Keith is a 33 y.o. at [redacted]w[redacted]d here in MAU reporting: contractions and LOF, LOF about 45 min ago and clear. No bleeding. No complications with pregnancy.  Onset of complaint: today  Pain score: 8/10  Vitals:   12/29/18 1251  BP: 140/76  Pulse: 84  Resp: 20  Temp: 98.5 F (36.9 C)  SpO2: 100%      Lab orders placed from triage: none

## 2018-12-30 ENCOUNTER — Encounter (HOSPITAL_COMMUNITY): Payer: Self-pay

## 2018-12-30 LAB — CBC
HCT: 27.1 % — ABNORMAL LOW (ref 36.0–46.0)
Hemoglobin: 9.4 g/dL — ABNORMAL LOW (ref 12.0–15.0)
MCH: 30.2 pg (ref 26.0–34.0)
MCHC: 34.7 g/dL (ref 30.0–36.0)
MCV: 87.1 fL (ref 80.0–100.0)
Platelets: 208 10*3/uL (ref 150–400)
RBC: 3.11 MIL/uL — ABNORMAL LOW (ref 3.87–5.11)
RDW: 13.2 % (ref 11.5–15.5)
WBC: 15.4 10*3/uL — ABNORMAL HIGH (ref 4.0–10.5)
nRBC: 0 % (ref 0.0–0.2)

## 2018-12-30 LAB — RPR: RPR Ser Ql: NONREACTIVE

## 2018-12-30 MED ORDER — ONDANSETRON HCL 4 MG/2ML IJ SOLN: 4.0000 mg | INTRAMUSCULAR | Status: DC | PRN

## 2018-12-30 MED ORDER — METHYLERGONOVINE MALEATE 0.2 MG/ML IJ SOLN
INTRAMUSCULAR | Status: AC
  Administered 2018-12-30: 0.2 mg via INTRAMUSCULAR
  Filled 2018-12-30: qty 1

## 2018-12-30 MED ORDER — SENNOSIDES-DOCUSATE SODIUM 8.6-50 MG PO TABS
2.0000 | ORAL_TABLET | ORAL | Status: DC
  Administered 2018-12-30 – 2018-12-31 (×3): 2 via ORAL
  Filled 2018-12-30 (×3): qty 2

## 2018-12-30 MED ORDER — BISACODYL 10 MG RE SUPP: 10.0000 mg | Freq: Every day | RECTAL | Status: DC | PRN

## 2018-12-30 MED ORDER — MEDROXYPROGESTERONE ACETATE 150 MG/ML IM SUSP
150.0000 mg | INTRAMUSCULAR | Status: DC | PRN
  Filled 2018-12-30: qty 1

## 2018-12-30 MED ORDER — TETANUS-DIPHTH-ACELL PERTUSSIS 5-2.5-18.5 LF-MCG/0.5 IM SUSP: 0.5000 mL | Freq: Once | INTRAMUSCULAR | Status: DC

## 2018-12-30 MED ORDER — MEASLES, MUMPS & RUBELLA VAC IJ SOLR: 0.5000 mL | Freq: Once | INTRAMUSCULAR | Status: DC

## 2018-12-30 MED ORDER — COCONUT OIL OIL
1.0000 "application " | TOPICAL_OIL | Status: DC | PRN
  Administered 2019-01-01: 1 via TOPICAL

## 2018-12-30 MED ORDER — PRENATAL MULTIVITAMIN CH
1.0000 | ORAL_TABLET | Freq: Every day | ORAL | Status: DC
  Administered 2018-12-30 – 2018-12-31 (×2): 1 via ORAL
  Filled 2018-12-30 (×2): qty 1

## 2018-12-30 MED ORDER — DIBUCAINE (PERIANAL) 1 % EX OINT
1.0000 "application " | TOPICAL_OINTMENT | CUTANEOUS | Status: DC | PRN
  Administered 2018-12-31: 1 via RECTAL
  Filled 2018-12-30: qty 28

## 2018-12-30 MED ORDER — ONDANSETRON HCL 4 MG PO TABS: 4.0000 mg | ORAL_TABLET | ORAL | Status: DC | PRN

## 2018-12-30 MED ORDER — BENZOCAINE-MENTHOL 20-0.5 % EX AERO
1.0000 "application " | INHALATION_SPRAY | CUTANEOUS | Status: DC | PRN
  Administered 2018-12-30: 1 via TOPICAL
  Filled 2018-12-30 (×2): qty 56

## 2018-12-30 MED ORDER — DIPHENHYDRAMINE HCL 25 MG PO CAPS: 25.0000 mg | ORAL_CAPSULE | Freq: Four times a day (QID) | ORAL | Status: DC | PRN

## 2018-12-30 MED ORDER — WITCH HAZEL-GLYCERIN EX PADS
1.0000 "application " | MEDICATED_PAD | CUTANEOUS | Status: DC | PRN
  Administered 2018-12-31: 1 via TOPICAL

## 2018-12-30 MED ORDER — FLEET ENEMA 7-19 GM/118ML RE ENEM: 1.0000 | ENEMA | Freq: Every day | RECTAL | Status: DC | PRN

## 2018-12-30 MED ORDER — METHYLERGONOVINE MALEATE 0.2 MG/ML IJ SOLN
0.2000 mg | Freq: Once | INTRAMUSCULAR | Status: AC
  Administered 2018-12-30: 07:00:00 0.2 mg via INTRAMUSCULAR

## 2018-12-30 MED ORDER — ACETAMINOPHEN 325 MG PO TABS
650.0000 mg | ORAL_TABLET | ORAL | Status: DC | PRN
  Administered 2018-12-30 – 2019-01-01 (×8): 650 mg via ORAL
  Filled 2018-12-30 (×8): qty 2

## 2018-12-30 MED ORDER — IBUPROFEN 600 MG PO TABS
600.0000 mg | ORAL_TABLET | Freq: Four times a day (QID) | ORAL | Status: DC
  Administered 2018-12-30 – 2019-01-01 (×10): 600 mg via ORAL
  Filled 2018-12-30 (×10): qty 1

## 2018-12-30 MED ORDER — ZOLPIDEM TARTRATE 5 MG PO TABS: 5.0000 mg | ORAL_TABLET | Freq: Every evening | ORAL | Status: DC | PRN

## 2018-12-30 MED ORDER — SIMETHICONE 80 MG PO CHEW: 80.0000 mg | CHEWABLE_TABLET | ORAL | Status: DC | PRN

## 2018-12-30 NOTE — Anesthesia Postprocedure Evaluation (Signed)
Anesthesia Post Note  Patient: Belinda Keith  Procedure(s) Performed: AN AD HOC LABOR EPIDURAL     Patient location during evaluation: Mother Baby Anesthesia Type: Epidural Level of consciousness: awake and alert and oriented Pain management: satisfactory to patient Vital Signs Assessment: post-procedure vital signs reviewed and stable Respiratory status: spontaneous breathing and nonlabored ventilation Cardiovascular status: stable Postop Assessment: no headache, no backache, no signs of nausea or vomiting, adequate PO intake, patient able to bend at knees and able to ambulate (patient up walking) Anesthetic complications: no    Last Vitals:  Vitals:   12/30/18 0055 12/30/18 0518  BP: 139/85 134/87  Pulse: (!) 106 68  Resp: 17 16  Temp: 37.4 C 36.6 C  SpO2:      Last Pain:  Vitals:   12/30/18 0710  TempSrc:   PainSc: 0-No pain   Pain Goal: Patients Stated Pain Goal: 10 (12/29/18 1404)              Epidural/Spinal Function Cutaneous sensation: Normal sensation (12/30/18 0710)  Willa Rough

## 2018-12-30 NOTE — Progress Notes (Signed)
PPD # 1  Doing well Had passed a clot this am - much better now. Baby in NICU secondary to TTN  BP 134/87 (BP Location: Left Arm)   Pulse 68   Temp 97.9 F (36.6 C) (Oral)   Resp 16   SpO2 100%   Breastfeeding Unknown  Results for orders placed or performed during the hospital encounter of 12/29/18 (from the past 24 hour(s))  Fern Test     Status: None   Collection Time: 12/29/18 12:57 PM  Result Value Ref Range   POCT Fern Test Positive = ruptured amniotic membanes   SARS CORONAVIRUS 2 (TAT 6-24 HRS) Nasopharyngeal Nasopharyngeal Swab     Status: None   Collection Time: 12/29/18 12:57 PM   Specimen: Nasopharyngeal Swab  Result Value Ref Range   SARS Coronavirus 2 NEGATIVE NEGATIVE  CBC     Status: Abnormal   Collection Time: 12/29/18  1:27 PM  Result Value Ref Range   WBC 15.0 (H) 4.0 - 10.5 K/uL   RBC 3.92 3.87 - 5.11 MIL/uL   Hemoglobin 11.7 (L) 12.0 - 15.0 g/dL   HCT 34.6 (L) 36.0 - 46.0 %   MCV 88.3 80.0 - 100.0 fL   MCH 29.8 26.0 - 34.0 pg   MCHC 33.8 30.0 - 36.0 g/dL   RDW 13.2 11.5 - 15.5 %   Platelets 252 150 - 400 K/uL   nRBC 0.0 0.0 - 0.2 %  Type and screen [place]     Status: None   Collection Time: 12/29/18  1:28 PM  Result Value Ref Range   ABO/RH(D) A POS    Antibody Screen NEG    Sample Expiration      01/01/2019,2359 Performed at [facility], [place] [address] [place], [place] [address]   ABO/Rh     Status: None   Collection Time: 12/29/18  1:28 PM  Result Value Ref Range   ABO/RH(D)      A POS Performed at [facility] [address], [place], [place] [address]   CBC     Status: Abnormal   Collection Time: 12/30/18  5:40 AM  Result Value Ref Range   WBC 15.4 (H) 4.0 - 10.5 K/uL   RBC 3.11 (L) 3.87 - 5.11 MIL/uL   Hemoglobin 9.4 (L) 12.0 - 15.0 g/dL   HCT 27.1 (L) 36.0 - 46.0 %   MCV 87.1 80.0 - 100.0 fL   MCH 30.2 26.0 - 34.0 pg   MCHC 34.7 30.0 - 36.0 g/dL   RDW 13.2 11.5 - 15.5 %   Platelets 208 150 -  400 K/uL   nRBC 0.0 0.0 - 0.2 %   Abdomen soft and non tender Lochia WNL  PPD # 1  Doing well Routine care

## 2018-12-30 NOTE — Lactation Note (Signed)
This note was copied from a baby's chart. Lactation Consultation Note Baby 45 hrs old. Not interested in feeding. NICU RN stated baby needs to eat.  Hand expressed collecting a few drops from mom. Mom has full round breast. Mom has generalized edema. Breast feel edematous. Mom has been using DEBP and hand pump. Encouraged to cont. Q3 hrs. Stimulated baby w/gloved finger to suckle then placed at breast in football position. Mom is unable to sit upright d/t perineal pain.   Mom has semi flat nipples, not very compressible. Baby wouldn't be able to obtain latch.  Fitted mom w/#20 NS. Taught application, mom demonstrated application. Baby finally suckled at breast a good burst of suckling. Rest then suckled more. Maybe a minute of feeding. No crying or fussing noted, very sleepy. Noted a lot of colostrum pooled in NS. Praised mom.  Respirations increased to 100 per minute per RN.  Laid baby on mom's chest STS. Discussed not able to feed w/elevated respiration. Collected a few more drops of colostrum.  Discussed w/mom since baby is over 78 hrs old baby needs supplemented w/colostrum and formula until mom's volume increases. Mom in agreement.  Spoke w/NICU RN regarding status of baby and needing to supplement.  LC will f/u w/mom later today.  Patient Name: Belinda Keith [id number] Date: 12/30/2018 Reason for consult: Initial assessment;Primapara;NICU baby;Early term 37-38.6wks   Maternal Data Has patient been taught Hand Expression?: Yes Does the patient have breastfeeding experience prior to this delivery?: No  Feeding Feeding Type: Breast Fed  LATCH Score Latch: Too sleepy or reluctant, no latch achieved, no sucking elicited.  Audible Swallowing: None  Type of Nipple: Flat(semi flat)  Comfort (Breast/Nipple): Filling, red/small blisters or bruises, mild/mod discomfort(edema to breast)  Hold (Positioning): Full assist, staff holds infant at breast  LATCH Score:  2  Interventions Interventions: Breast feeding basics reviewed;Adjust position;Assisted with latch;Support pillows;Skin to skin;Position options;Breast massage;Expressed milk;Hand express;Pre-pump if needed;Shells;Reverse pressure;Breast compression;Hand pump  Lactation Tools Discussed/Used Tools: Shells;Pump;Nipple Shields Nipple shield size: 20 Shell Type: Inverted Breast pump type: Double-Electric Breast Pump;Manual WIC Program: No   Consult Status Consult Status: Follow-up Date: 12/31/18 Follow-up type: In-patient    Theodoro Kalata 12/30/2018, 11:51 PM

## 2018-12-30 NOTE — Lactation Note (Signed)
This note was copied from a baby's chart. Lactation Consultation Note  Patient Name: Belinda Keith Date: 12/30/2018 Reason for consult: Initial assessment;Early term 37-38.6wks;Primapara;1st time breastfeeding  P1 mother whose infant is now 37 hours old and in the NICU.  This is an ETI at 37+4 weeks who was admitted for respiratory difficulties after birth.  Baby was asleep in mother's arms when I arrived.  Mother stated that he has attempted breast feeding twice but is very sleepy.  Discussed the typical behavior of a baby at this age and hours of life.  Reassured mother.    Encouraged to attempt breast feeding when he shows cues or at least 8-12 times/24 hours.  Mother is familiar with feeding cues and hand expression.  She has been able to express colostrum drops and will feed back any EBM she obtains to baby.  Colostrum vials at bedside.    Offered to assist with latching an desired.  Mother appreciative and seems to be calm and understands that baby will gradually improve with breast feeding.  She has a manual pump at bedside and has no questions about using this pump.  She has not been pumping consistently with the DEBP at bedside because she prefers the manual pump.  I explained the benefits of pumping for 15 minutes at least every three hours with the DEBP.  Mother informed me that she will begin doing this.  Suggested she may continue hand expression before/after pumping and using her manual pump as desired.    Mom made aware of O/P services, breastfeeding support groups, community resources, and our phone # for post-discharge questions. "Providing Breast Milk For Your Baby in the NICU" booklet given.  Mother has a DEBP for home use.  Father present and asleep on the couch.  RN updated.  Mother will call for latch assistance as needed.   Maternal Data Formula Feeding for Exclusion: No Has patient been taught Hand Expression?: Yes Does the patient have breastfeeding  experience prior to this delivery?: No  Feeding Feeding Type: Breast Fed  LATCH Score Latch: Repeated attempts needed to sustain latch, nipple held in mouth throughout feeding, stimulation needed to elicit sucking reflex.  Audible Swallowing: A few with stimulation  Type of Nipple: Everted at rest and after stimulation  Comfort (Breast/Nipple): Soft / non-tender  Hold (Positioning): Assistance needed to correctly position infant at breast and maintain latch.  LATCH Score: 7  Interventions Interventions: Assisted with latch;Skin to skin;Support pillows  Lactation Tools Discussed/Used     Consult Status Consult Status: Follow-up Date: 12/31/18 Follow-up type: In-patient    [name] R Kyani Simkin 12/30/2018, 2:25 PM

## 2018-12-31 NOTE — Progress Notes (Signed)
Post Partum Day 2 - s/p SROM --> VAVD Subjective: no complaints, up ad lib, voiding, tolerating PO and trouble breastfeeding  Objective: Blood pressure 128/78, pulse 86, temperature 98 F (36.7 C), temperature source Oral, resp. rate 18, SpO2 100 %, unknown if currently breastfeeding.  Physical Exam:  General: alert, cooperative and appears stated age Lochia: appropriate Uterine Fundus: firm Incision: healing well, no significant drainage, no dehiscence, no significant erythema DVT Evaluation: No evidence of DVT seen on physical exam. Negative Homan's sign. No cords or calf tenderness. No significant calf/ankle edema.  Recent Labs    12/29/18 1327 12/30/18 0540  HGB 11.7* 9.4*  HCT 34.6* 27.1*    Assessment/Plan: Plan for discharge tomorrow, Breastfeeding and Lactation consult  Delivered after 10pm < 48 hours ago. Trouble breastfeeding and baby still in NICU not ready for discharge. No s/s anemia. Plan stay overnight one more night. Plan d/c in AM. Baby not yet ready for circ.    LOS: 2 days   Tyson Dense 12/31/2018, 9:41 AM

## 2019-01-01 ENCOUNTER — Ambulatory Visit: Payer: Self-pay

## 2019-01-01 MED ORDER — IBUPROFEN 800 MG PO TABS: 800.0000 mg | ORAL_TABLET | Freq: Four times a day (QID) | ORAL | 0 refills | Status: AC

## 2019-01-01 NOTE — Lactation Note (Signed)
This note was copied from a baby's chart. Lactation Consultation Note: LC to visit mother in infants room. Mother left hospital for a while. Will continue to follow up as needed.  Patient Name: Belinda Keith [id number] Date: 01/01/2019     Maternal Data    Feeding    LATCH Score                   Interventions    Lactation Tools Discussed/Used     Consult Status      Jess Barters [facility] 01/01/2019, 3:37 PM

## 2019-01-01 NOTE — Discharge Summary (Signed)
Obstetric Discharge Summary Reason for Admission: onset of labor Prenatal Procedures: none Intrapartum Procedures: spontaneous vaginal delivery and vacuum Postpartum Procedures: none Complications-Operative and Postpartum: 2nd degree perineal laceration Hemoglobin  Date Value Ref Range Status  12/30/2018 9.4 (L) 12.0 - 15.0 g/dL Final   HCT  Date Value Ref Range Status  12/30/2018 27.1 (L) 36.0 - 46.0 % Final    Physical Exam:  General: alert, cooperative and appears stated age 33: appropriate Uterine Fundus: firm Incision: healing well, no significant drainage, no dehiscence DVT Evaluation: No evidence of DVT seen on physical exam. Negative Homan's sign. No cords or calf tenderness.  Discharge Diagnoses: Term Pregnancy-delivered  Discharge Information: Date: 01/01/2019 Activity: pelvic rest Diet: routine Medications: PNV and Ibuprofen Condition: stable Instructions: refer to practice specific booklet Discharge to: home   Newborn Data: Live born female  Birth Weight: 7 lb 13.6 oz (3560 g) APGAR: 8, 8  Newborn Delivery   Birth date/time: 12/29/2016 21:43:00 Delivery type: Vaginal, Vacuum (Extractor)      Home with mother.  Linda Hedges 01/01/2019, 10:03 AM

## 2019-01-01 NOTE — Lactation Note (Signed)
This note was copied from a baby's chart. Lactation Consultation Note Called to check on mom and baby feeding. RN stated they are sleeping at this time. Mom is BF w/NS then supplementing w/bottle afterwards. RN stated mom doesn't call for assistance BF. Encouraged RN to tell mom to call for assistance if needed.  Patient Name: Belinda Keith [id number] Date: 01/01/2019     Maternal Data    Feeding Feeding Type: Formula Nipple Type: Nfant Standard Flow (white)  LATCH Score                   Interventions    Lactation Tools Discussed/Used     Consult Status      Belinda Keith 01/01/2019, 4:25 AM

## 2019-01-01 NOTE — Progress Notes (Signed)
Post Partum Day 3 Subjective: no complaints, up ad lib, voiding and tolerating PO.  Patient requests circ prior to NICU discharge.  Objective: Blood pressure 139/81, pulse 87, temperature 98.4 F (36.9 C), temperature source Oral, resp. rate 18, SpO2 99 %, unknown if currently breastfeeding.  Physical Exam:  General: alert, cooperative and appears stated age Lochia: appropriate Uterine Fundus: firm Incision: healing well, no significant drainage, no dehiscence DVT Evaluation: No evidence of DVT seen on physical exam. Negative Homan's sign. No cords or calf tenderness.  Recent Labs    12/29/18 1327 12/30/18 0540  HGB 11.7* 9.4*  HCT 34.6* 27.1*    Assessment/Plan: Discharge home and Circumcision prior to discharge  Baby will not be discharged home today.  Will plan circ tomorrow AM per NICU request.  Mother and father are counseled re: risk of circ to include risk of bleeding, infection and scarring.  All questions were answered.    LOS: 3 days   Linda Hedges 01/01/2019, 9:58 AM

## 2019-01-01 NOTE — Progress Notes (Signed)
Patient screened out for psychosocial assessment since none of the following apply:  Psychosocial stressors documented in mother or baby's chart  Gestation less than 32 weeks  Code at delivery   Infant with anomalies Please contact the Clinical Social Worker if specific needs arise, by MOB's request, or if MOB scores greater than 9/yes to question 10 on Edinburgh Postpartum Depression Screen.  Oron Westrup, LCSW Clinical Social Worker [facility] Cell#: [phone]     

## 2019-01-01 NOTE — Discharge Instructions (Signed)
Call MD for T>100.4, heavy vaginal bleeding, severe abdominal pain, or respiratory distress.  Call office to schedule postpartum visit in 6 weeks.  Pelvic rest x 6 weeks.   

## 2019-01-02 ENCOUNTER — Ambulatory Visit: Payer: Self-pay

## 2019-01-02 NOTE — Lactation Note (Signed)
This note was copied from a baby's chart. Lactation Consultation Note  Patient Name: Belinda Keith [id number] Date: 01/02/2019 Reason for consult: Follow-up assessment;Primapara;1st time breastfeeding;Early term 37-38.6wks;NICU baby  Visited with mom of an 56 hour old ETI NICU female. Mom and baby are going home today. Reviewed discharge instructions, engorgement prevention and treatment, treatment/prevention of sore nipples and pumping schedule. Mom has been pumping every 2-3 hours, and already getting about 30 ml per pumping session, praised her for her efforts. However she said she's still using the NS # 20 in order to latch baby to breast, and requested an extra one to take home, LC provided one for mom.  Encouraged mom to keep working on BF and to follow up with [facility] but mom said she's going to try on her own first before making an appt. Baby is also getting supplemented with Similac 20 calorie formula. Parents reported all questions and concerns were answered, they're both aware of [facility] services and will call PRN.  Maternal Data    Feeding Feeding Type: Breast Milk with Formula added Nipple Type: Nfant Standard Flow (white)  LATCH Score                   Interventions Interventions: Breast feeding basics reviewed;Coconut oil;DEBP  Lactation Tools Discussed/Used Tools: Nipple Shields;Pump;Coconut oil Nipple shield size: 20 Breast pump type: Double-Electric Breast Pump   Consult Status Consult Status: Complete Date: 01/02/19 Follow-up type: Call as needed    [place] 01/02/2019, 1:05 PM

## 2019-01-15 ENCOUNTER — Inpatient Hospital Stay (HOSPITAL_COMMUNITY): Admission: AD | Admit: 2019-01-15 | Payer: 59 | Source: Home / Self Care

## 2023-06-19 ENCOUNTER — Other Ambulatory Visit: Payer: Self-pay | Admitting: Obstetrics and Gynecology

## 2023-06-19 DIAGNOSIS — R928 Other abnormal and inconclusive findings on diagnostic imaging of breast: Secondary | ICD-10-CM

## 2023-06-30 ENCOUNTER — Ambulatory Visit
Admission: RE | Admit: 2023-06-30 | Discharge: 2023-06-30 | Disposition: A | Discharge status: Home / Self Care | Source: Ambulatory Visit | Attending: Obstetrics and Gynecology | Admitting: Obstetrics and Gynecology

## 2023-06-30 ENCOUNTER — Other Ambulatory Visit: Payer: Self-pay | Admitting: Obstetrics and Gynecology

## 2023-06-30 DIAGNOSIS — R928 Other abnormal and inconclusive findings on diagnostic imaging of breast: Secondary | ICD-10-CM

## 2023-06-30 DIAGNOSIS — N6320 Unspecified lump in the left breast, unspecified quadrant: Secondary | ICD-10-CM

## 2023-07-04 ENCOUNTER — Ambulatory Visit
Admission: RE | Admit: 2023-07-04 | Discharge: 2023-07-04 | Discharge status: Home / Self Care | Source: Ambulatory Visit | Attending: Obstetrics and Gynecology | Admitting: Obstetrics and Gynecology

## 2023-07-04 ENCOUNTER — Ambulatory Visit
Admission: RE | Admit: 2023-07-04 | Discharge: 2023-07-04 | Disposition: A | Discharge status: Home / Self Care | Source: Ambulatory Visit | Attending: Obstetrics and Gynecology | Admitting: Obstetrics and Gynecology

## 2023-07-04 DIAGNOSIS — N6320 Unspecified lump in the left breast, unspecified quadrant: Secondary | ICD-10-CM

## 2023-07-04 HISTORY — PX: BREAST BIOPSY: SHX20

## 2023-07-05 LAB — SURGICAL PATHOLOGY
